# Patient Record
Sex: Male | Born: 1990 | Race: White | Hispanic: No | Marital: Single | Smoking: Current every day smoker
Health system: Southern US, Community
[De-identification: ages and names within clinical notes are randomized; demographics above are authoritative.]

---

## 2008-02-12 ENCOUNTER — Emergency Department (HOSPITAL_COMMUNITY): Admission: EM | Admit: 2008-02-12 | Discharge: 2008-02-12 | Payer: Self-pay | Admitting: Emergency Medicine

## 2008-11-23 ENCOUNTER — Emergency Department (HOSPITAL_COMMUNITY): Admission: EM | Admit: 2008-11-23 | Discharge: 2008-11-23 | Payer: Self-pay | Admitting: Emergency Medicine

## 2009-07-10 ENCOUNTER — Emergency Department (HOSPITAL_COMMUNITY): Admission: EM | Admit: 2009-07-10 | Discharge: 2009-07-10 | Payer: Self-pay | Admitting: Emergency Medicine

## 2010-06-19 ENCOUNTER — Emergency Department (HOSPITAL_COMMUNITY)
Admission: EM | Admit: 2010-06-19 | Discharge: 2010-06-19 | Payer: Self-pay | Source: Home / Self Care | Admitting: Emergency Medicine

## 2013-10-26 ENCOUNTER — Encounter (HOSPITAL_COMMUNITY): Payer: Self-pay | Admitting: Emergency Medicine

## 2013-10-26 ENCOUNTER — Inpatient Hospital Stay (HOSPITAL_COMMUNITY)
Admission: EM | Admit: 2013-10-26 | Discharge: 2013-10-28 | DRG: 684 | Disposition: A | Discharge status: Home / Self Care | Payer: Self-pay | Attending: Internal Medicine | Admitting: Internal Medicine

## 2013-10-26 ENCOUNTER — Emergency Department (HOSPITAL_COMMUNITY): Payer: Self-pay

## 2013-10-26 DIAGNOSIS — Z8261 Family history of arthritis: Secondary | ICD-10-CM

## 2013-10-26 DIAGNOSIS — T675XXA Heat exhaustion, unspecified, initial encounter: Secondary | ICD-10-CM | POA: Diagnosis present

## 2013-10-26 DIAGNOSIS — X30XXXA Exposure to excessive natural heat, initial encounter: Secondary | ICD-10-CM | POA: Diagnosis present

## 2013-10-26 DIAGNOSIS — N179 Acute kidney failure, unspecified: Principal | ICD-10-CM | POA: Diagnosis present

## 2013-10-26 DIAGNOSIS — F17200 Nicotine dependence, unspecified, uncomplicated: Secondary | ICD-10-CM | POA: Diagnosis present

## 2013-10-26 DIAGNOSIS — E876 Hypokalemia: Secondary | ICD-10-CM | POA: Diagnosis present

## 2013-10-26 DIAGNOSIS — R55 Syncope and collapse: Secondary | ICD-10-CM | POA: Diagnosis present

## 2013-10-26 DIAGNOSIS — Y93H2 Activity, gardening and landscaping: Secondary | ICD-10-CM

## 2013-10-26 DIAGNOSIS — E860 Dehydration: Secondary | ICD-10-CM | POA: Diagnosis present

## 2013-10-26 DIAGNOSIS — R112 Nausea with vomiting, unspecified: Secondary | ICD-10-CM | POA: Diagnosis present

## 2013-10-26 DIAGNOSIS — Z825 Family history of asthma and other chronic lower respiratory diseases: Secondary | ICD-10-CM

## 2013-10-26 LAB — URINALYSIS, ROUTINE W REFLEX MICROSCOPIC
BILIRUBIN URINE: NEGATIVE
Glucose, UA: NEGATIVE mg/dL
Hgb urine dipstick: NEGATIVE
KETONES UR: NEGATIVE mg/dL
Leukocytes, UA: NEGATIVE
Nitrite: NEGATIVE
PH: 6 (ref 5.0–8.0)
Protein, ur: NEGATIVE mg/dL
Specific Gravity, Urine: 1.006 (ref 1.005–1.030)
Urobilinogen, UA: 0.2 mg/dL (ref 0.0–1.0)

## 2013-10-26 LAB — COMPREHENSIVE METABOLIC PANEL
ALBUMIN: 5.8 g/dL — AB (ref 3.5–5.2)
ALK PHOS: 108 U/L (ref 39–117)
ALT: 34 U/L (ref 0–53)
AST: 34 U/L (ref 0–37)
BUN: 33 mg/dL — ABNORMAL HIGH (ref 6–23)
CALCIUM: 11.2 mg/dL — AB (ref 8.4–10.5)
CO2: 19 meq/L (ref 19–32)
CREATININE: 3.84 mg/dL — AB (ref 0.50–1.35)
Chloride: 90 mEq/L — ABNORMAL LOW (ref 96–112)
GFR calc non Af Amer: 20 mL/min — ABNORMAL LOW (ref >90)
GFR, EST AFRICAN AMERICAN: 24 mL/min — AB (ref >90)
Glucose, Bld: 173 mg/dL — ABNORMAL HIGH (ref 70–99)
Potassium: 4.1 mEq/L (ref 3.7–5.3)
SODIUM: 136 meq/L — AB (ref 137–147)
TOTAL PROTEIN: 10.1 g/dL — AB (ref 6.0–8.3)
Total Bilirubin: 1.9 mg/dL — ABNORMAL HIGH (ref 0.3–1.2)

## 2013-10-26 LAB — CBC WITH DIFFERENTIAL/PLATELET
BASOS PCT: 0 % (ref 0–1)
Basophils Absolute: 0.1 10*3/uL (ref 0.0–0.1)
EOS ABS: 0 10*3/uL (ref 0.0–0.7)
EOS PCT: 0 % (ref 0–5)
HCT: 56.6 % — ABNORMAL HIGH (ref 39.0–52.0)
Hemoglobin: 20.6 g/dL — ABNORMAL HIGH (ref 13.0–17.0)
Lymphocytes Relative: 9 % — ABNORMAL LOW (ref 12–46)
Lymphs Abs: 1.7 10*3/uL (ref 0.7–4.0)
MCH: 31.7 pg (ref 26.0–34.0)
MCHC: 36.4 g/dL — ABNORMAL HIGH (ref 30.0–36.0)
MCV: 87.1 fL (ref 78.0–100.0)
MONO ABS: 1 10*3/uL (ref 0.1–1.0)
MONOS PCT: 6 % (ref 3–12)
NEUTROS ABS: 15.8 10*3/uL — AB (ref 1.7–7.7)
NEUTROS PCT: 85 % — AB (ref 43–77)
Platelets: 296 10*3/uL (ref 150–400)
RBC: 6.5 MIL/uL — AB (ref 4.22–5.81)
RDW: 12.3 % (ref 11.5–15.5)
WBC: 18.6 10*3/uL — AB (ref 4.0–10.5)

## 2013-10-26 LAB — LIPASE, BLOOD: Lipase: 22 U/L (ref 11–59)

## 2013-10-26 LAB — I-STAT TROPONIN, ED: Troponin i, poc: 0.03 ng/mL (ref 0.00–0.08)

## 2013-10-26 MED ORDER — SODIUM CHLORIDE 0.9 % IV SOLN
INTRAVENOUS | Status: DC
  Administered 2013-10-26 – 2013-10-27 (×3): via INTRAVENOUS

## 2013-10-26 MED ORDER — SODIUM CHLORIDE 0.9 % IV BOLUS (SEPSIS)
1000.0000 mL | Freq: Once | INTRAVENOUS | Status: AC
  Administered 2013-10-26: 1000 mL via INTRAVENOUS

## 2013-10-26 MED ORDER — ONDANSETRON HCL 4 MG/2ML IJ SOLN: 4.0000 mg | Freq: Four times a day (QID) | INTRAMUSCULAR | Status: DC | PRN

## 2013-10-26 MED ORDER — ENOXAPARIN SODIUM 40 MG/0.4ML ~~LOC~~ SOLN
40.0000 mg | SUBCUTANEOUS | Status: DC
  Administered 2013-10-27: 40 mg via SUBCUTANEOUS
  Filled 2013-10-26 (×2): qty 0.4

## 2013-10-26 MED ORDER — SODIUM CHLORIDE 0.9 % IV BOLUS (SEPSIS)
1000.0000 mL | Freq: Once | INTRAVENOUS | Status: AC
Start: 2013-10-26 — End: 2013-10-26
  Administered 2013-10-26: 1000 mL via INTRAVENOUS

## 2013-10-26 MED ORDER — ONDANSETRON HCL 4 MG PO TABS: 4.0000 mg | ORAL_TABLET | Freq: Four times a day (QID) | ORAL | Status: DC | PRN

## 2013-10-26 MED ORDER — OXYCODONE HCL 5 MG PO TABS: 5.0000 mg | ORAL_TABLET | ORAL | Status: DC | PRN

## 2013-10-26 MED ORDER — HYDROMORPHONE HCL PF 1 MG/ML IJ SOLN: 0.5000 mg | INTRAMUSCULAR | Status: DC | PRN

## 2013-10-26 MED ORDER — ONDANSETRON HCL 4 MG/2ML IJ SOLN
4.0000 mg | Freq: Once | INTRAMUSCULAR | Status: AC
  Administered 2013-10-26: 4 mg via INTRAVENOUS
  Filled 2013-10-26: qty 2

## 2013-10-26 MED ORDER — ACETAMINOPHEN 325 MG PO TABS: 650.0000 mg | ORAL_TABLET | Freq: Four times a day (QID) | ORAL | Status: DC | PRN

## 2013-10-26 MED ORDER — ACETAMINOPHEN 650 MG RE SUPP
650.0000 mg | Freq: Four times a day (QID) | RECTAL | Status: DC | PRN
Start: 2013-10-26 — End: 2013-10-28

## 2013-10-26 NOTE — ED Provider Notes (Signed)
CSN: [id number]     Arrival date & time 10/26/13  1804 History   First MD Initiated Contact with Patient 10/26/13 1939     Chief Complaint  Patient presents with  . Loss of Consciousness  . Emesis     (Consider location/radiation/quality/duration/timing/severity/associated sxs/prior Treatment) HPI 23 year old male presents with multiple episodes of vomiting, nausea and abdominal pain. He states he passed out twice today. He's been feeling lightheaded almost all day. 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 Today since about 9:30 he been having severe symptoms. He is having uncontrolled nausea and vomiting. He mostly has right upper quadrant abdominal pain when he is vomiting at this time and has not had any pain. He's never had any prior medical problems including no kidney disease, heart conditions, or other symptoms. The first time he passed out he was apparently mowing the lawn felt lightheaded and fell to the grass. He apparently woke up immediately after. The second time he was feeling lightheaded tried to sit down but currently fell face first on the truck. He currently feels lightheaded but denies any chest pain or headaches. He's been having decreased urine output as well.  History reviewed. No pertinent past medical history. History reviewed. No pertinent past surgical history. History reviewed. No pertinent family history. History  Substance Use Topics  . Smoking status: Current Every Day Smoker -- 0.50 packs/day    Types: Cigarettes  . Smokeless tobacco: Not on file  . Alcohol Use: Yes    Review of Systems  Respiratory: Positive for cough. Negative for shortness of breath.   Cardiovascular: Negative for chest pain.  Gastrointestinal: Positive for nausea, vomiting and abdominal pain. Negative for diarrhea and constipation.  Genitourinary: Positive for decreased urine volume. Negative for dysuria.   Musculoskeletal: Negative for back pain.  Neurological: Positive for dizziness and light-headedness.  All other systems reviewed and are negative.     Allergies  Review of patient's allergies indicates no known allergies.  Home Medications   Prior to Admission medications   Not on File   BP 130/90  Pulse 117  Temp(Src) 97.3 F (36.3 C) (Oral)  Resp 18  SpO2 97% Physical Exam  Nursing note and vitals reviewed. Constitutional: He is oriented to person, place, and time. He appears well-developed and well-nourished.  HENT:  Head: Normocephalic and atraumatic.  Right Ear: External ear normal.  Left Ear: External ear normal.  Nose: Nose normal.  Mouth/Throat: Mucous membranes are normal.  Eyes: Right eye exhibits no discharge. Left eye exhibits no discharge.  Neck: Neck supple.  Cardiovascular: Normal rate, regular rhythm, normal heart sounds and intact distal pulses.   Pulmonary/Chest: Effort normal.  Abdominal: Soft. There is no tenderness.  Musculoskeletal: He exhibits no edema.  Neurological: He is alert and oriented to person, place, and time.  Skin: Skin is warm and dry.    ED Course  Procedures (including critical care time) Labs Review Labs Reviewed  CBC WITH DIFFERENTIAL - Abnormal; Notable for the following:    WBC 18.6 (*)    RBC 6.50 (*)    Hemoglobin 20.6 (*)    HCT 56.6 (*)    MCHC 36.4 (*)    Neutrophils Relative % 85 (*)    Neutro Abs 15.8 (*)    Lymphocytes Relative 9 (*)    All other components within normal limits  COMPREHENSIVE METABOLIC PANEL - Abnormal; Notable for the following:    Sodium  136 (*)    Chloride 90 (*)    Glucose, Bld 173 (*)    BUN 33 (*)    Creatinine, Ser 3.84 (*)    Calcium 11.2 (*)    Total Protein 10.1 (*)    Albumin 5.8 (*)    Total Bilirubin 1.9 (*)    GFR calc non Af Amer 20 (*)    GFR calc Af Amer 24 (*)    All other components within normal limits  LIPASE, BLOOD  URINALYSIS, ROUTINE W REFLEX MICROSCOPIC   I-STAT TROPOININ, ED    Imaging Review Dg Abd Acute W/chest  10/26/2013   CLINICAL DATA:  Syncope.  Abdominal pain.  EXAM: ACUTE ABDOMEN SERIES (ABDOMEN 2 VIEW & CHEST 1 VIEW)  COMPARISON:  11/23/2008  FINDINGS: There is no evidence of dilated bowel loops or free intraperitoneal air. No radiopaque calculi or other significant radiographic abnormality is seen. Heart size and mediastinal contours are within normal limits. Both lungs are clear. Interval, healed mid left clavicle fracture.  IMPRESSION: Negative abdominal radiographs.  No acute cardiopulmonary disease.   Electronically Signed   By: Tiburcio Pea M.D.   On: 10/26/2013 20:51     EKG Interpretation   Date/Time:  Friday Oct 26 2013 18:18:09 EDT Ventricular Rate:  126 PR Interval:  124 QRS Duration: 84 QT Interval:  306 QTC Calculation: 443 R Axis:   83 Text Interpretation:  Sinus tachycardia Right atrial enlargement  Nonspecific ST abnormality Abnormal ECG No old tracing to compare  Confirmed by Long Brimage  MD, Brennan Karam ([id number]) on 10/26/2013 7:37:09 PM      MDM   Final diagnoses:  Dehydration  ARF (acute renal failure)  Heat exhaustion  Hypercalcemia  Nausea and vomiting  Syncope    Patient with what appears to be acute dehydration causing multiple episodes of syncope. His BUN is a little lower than expected given dehydration, but his Cr of 3.8 is concerning in otherwise healthy young male. Benign abdominal exam. Will hydrate and admit to medicine.    Audree Camel, MD 10/27/13 [phone]

## 2013-10-26 NOTE — Progress Notes (Signed)
Pt arrived on unit, alert and oriented. Able to make needs known. Appears to be in no distress. No SOB noted. IV on L hand clean, dry and intact, infusing well. Tattoos noted on BUE. Skin dry and intact. No complaints made at this time. Vital signs taken and stable. Pt care guide provided. We will continue to monitor.

## 2013-10-26 NOTE — ED Notes (Signed)
Pt has been working in the hot sun all day started getting ill at 1300 today.  Nausea with vomiting.  He is drinking sprite told to discontinue at present.  Iv placed nausea med given.  Alert oriented skin warm and dry nodistress

## 2013-10-26 NOTE — Progress Notes (Signed)
Received pt report from Emily,RN-ED. 

## 2013-10-26 NOTE — ED Notes (Signed)
The pt just returned from xray 

## 2013-10-26 NOTE — ED Notes (Addendum)
Pt reports lightheadedness and N/V starting yesterday. States "I have been throwing up so much. And this afternoon, my legs just gave out on me." Unsure of LOC, but states episode was witnessed and he stood up right after. Pt also reports 8/10 RLQ/RUQ starting this afternoon. Pt is AO x4, ambulatory to triage. Neuro intact. PERRLA, 55mm.

## 2013-10-26 NOTE — H&P (Signed)
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  He denies hematemesis,  He report also passing out twice.   He denies having any fevers or chills or diarrhea or chest pain or SOB.   He was brought to the ED, and he was evaluated and found to have abnormal lab studies of increased BUN/Cr as well as hemoconcentration.  He was administered IVFs and referred for medical admission.     Review of Systems:  Constitutional: No Weight Loss, No Weight Gain, Night Sweats, Fevers, Chills, +Fatigue, +Generalized Weakness HEENT: No Headaches, Difficulty Swallowing,Tooth/Dental Problems,Sore Throat,  No Sneezing, Rhinitis, Ear Ache, Nasal Congestion, or Post Nasal Drip,  Cardio-vascular:  No Chest pain, Orthopnea, PND, Edema in lower extremities, Anasarca, Dizziness, Palpitations  Resp: No Dyspnea, No DOE, No Cough, No Hemoptysis, No Wheezing.    GI: No Heartburn, Indigestion, Abdominal Pain, +Nausea, +Vomiting, Diarrhea, Change in Bowel Habits,  Loss of Appetite  GU: No Dysuria, Change in Color of Urine, No Urgency or Frequency.  No Flank pain.  Musculoskeletal: No Joint Pain or Swelling.  No Decreased Range of Motion. No Back Pain.  Neurologic: +Syncope, No Seizures, Muscle Weakness, Paresthesia, Vision Disturbance or Loss, No Diplopia, No Vertigo, No Difficulty Walking,  Skin: No Rash or Lesions. Psych: No Change in Mood or Affect. No Depression or Anxiety. No Memory loss. No Confusion or Hallucinations   History reviewed. No pertinent past medical history.   NONE   History reviewed.  No pertinent past surgical history.    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  He has been smoking about 0.50 packs per day. He does not have any smokeless tobacco history on file. He reports that he drinks alcohol. He reports that he does not use illicit drugs.     Family History  Problem Relation Age of Onset  . Asthma Mother   . Arthritis Mother        Physical Exam:  GEN:  Pleasant Sun Burned, Well Nourished and Well Developed 23 y.o. Caucasian male  examined  and in no acute distress; cooperative with exam Filed Vitals:   10/26/13 1816 10/26/13 1937 10/26/13 2015 10/26/13 2058  BP: 125/85 130/90 125/95 122/105  Pulse: 126 117 106 95  Temp: 97.3 F (36.3 C)  98.1 F (36.7 C)   TempSrc: Oral     Resp: 18 18 16 18   SpO2: 95% 97% 97% 96%   Blood pressure 122/105, pulse 95, temperature 98.1 F (36.7 C), temperature source Oral, resp. rate 18, SpO2 96.00%. PSYCH: He is alert and oriented x4; does not appear anxious does not appear depressed; affect is normal HEENT: Normocephalic and Atraumatic, Mucous membranes pink; PERRLA; EOM intact; Fundi:  Benign;  No scleral icterus, Nares: Patent, Oropharynx: Clear, Fair Dentition, Neck:  FROM, no cervical lymphadenopathy nor thyromegaly or carotid bruit; no JVD; Breasts:: Not examined CHEST WALL: No tenderness  CHEST: Normal respiration, clear to auscultation bilaterally HEART: Regular rate and rhythm; no murmurs rubs or gallops BACK: No kyphosis or scoliosis; no CVA tenderness ABDOMEN: Positive Bowel Sounds, Scaphoid, soft non-tender; no masses, no organomegaly. Rectal Exam: Not done EXTREMITIES: No cyanosis, clubbing or edema; no ulcerations. Genitalia: not examined PULSES: 2+ and symmetric SKIN: Decreased hydration,  no rashes or ulcerations CNS: Alert and Oriented x 4, No Focal Deficits.    Vascular: pulses palpable throughout    Labs on Admission:  Basic Metabolic Panel:  Recent Labs Lab 10/26/13 1819  NA 136*  K 4.1  CL 90*  CO2 19  GLUCOSE 173*  BUN 33*  CREATININE 3.84*  CALCIUM 11.2*   Liver Function Tests:  Recent Labs Lab 10/26/13 1819  AST 34  ALT 34  ALKPHOS 108  BILITOT 1.9*  PROT 10.1*  ALBUMIN 5.8*    Recent Labs Lab 10/26/13 1819  LIPASE 22   No results found for this basename: AMMONIA,  in the last 168 hours CBC:  Recent Labs Lab 10/26/13 1819  WBC 18.6*  NEUTROABS 15.8*  HGB 20.6*  HCT 56.6*  MCV 87.1  PLT 296   Cardiac Enzymes: No results found for this basename: CKTOTAL, CKMB, CKMBINDEX, TROPONINI,  in the last 168 hours  BNP (last 3 results) No results found for this basename: PROBNP,  in the last 8760 hours CBG: No results found for this basename: GLUCAP,  in the last 168 hours  Radiological Exams on Admission: Dg Abd Acute W/chest  10/26/2013   CLINICAL DATA:  Syncope.  Abdominal pain.  EXAM: ACUTE ABDOMEN SERIES (ABDOMEN 2 VIEW & CHEST 1 VIEW)  COMPARISON:  11/23/2008  FINDINGS: There is no evidence of dilated bowel loops or free intraperitoneal air. No radiopaque calculi or other significant radiographic abnormality is seen. Heart size and mediastinal contours are within normal limits. Both lungs are clear. Interval, healed mid left clavicle fracture.  IMPRESSION: Negative abdominal radiographs.  No acute cardiopulmonary disease.   Electronically Signed   By: Tiburcio PeaJonathan  Watts M.D.   On: 10/26/2013 20:51       Assessment/Plan:   23 y.o. male with  Principal Problem:   Dehydration Active Problems:   Nausea and vomiting   Heat exhaustion   Syncope   Hypercalcemia   ARF (acute renal failure)     1.  Dehydration/ ARF-  IVFs  For rehydration, and monitor BUN/Cr and electrolytes.     2.   Heat Exhaustion-  IVFs and supportive CAre, Monitor CPK levels.    3.   Syncope due to #1.    4.   Nausea and  Vomiting- due to #2,  Anti -Emetics PRN.    5.   Hypercalcemia- due to #1,  Monitor Calcium levels after hydration.    6.   DVT prophylaxis with Lovenox.        Code Status:    FULL CODE Family Communication:    No Family Present Disposition Plan:    Inpatient   Time spent:  5560 Minutes  Koran Seabrook Velora HecklerC Bryleigh Ottaway [facility] Pager [phone]  If 7PM-7AM, please contact night-coverage [email or web address] Password [email or web address] 10/26/2013, 10:27 PM

## 2013-10-27 LAB — BASIC METABOLIC PANEL
BUN: 38 mg/dL — ABNORMAL HIGH (ref 6–23)
CHLORIDE: 97 meq/L (ref 96–112)
CO2: 26 mEq/L (ref 19–32)
Calcium: 9.5 mg/dL (ref 8.4–10.5)
Creatinine, Ser: 2.07 mg/dL — ABNORMAL HIGH (ref 0.50–1.35)
GFR calc Af Amer: 50 mL/min — ABNORMAL LOW (ref >90)
GFR calc non Af Amer: 43 mL/min — ABNORMAL LOW (ref >90)
Glucose, Bld: 111 mg/dL — ABNORMAL HIGH (ref 70–99)
POTASSIUM: 3.6 meq/L — AB (ref 3.7–5.3)
Sodium: 138 mEq/L (ref 137–147)

## 2013-10-27 LAB — CBC
HEMATOCRIT: 47.7 % (ref 39.0–52.0)
HEMOGLOBIN: 17.6 g/dL — AB (ref 13.0–17.0)
MCH: 32.2 pg (ref 26.0–34.0)
MCHC: 36.9 g/dL — ABNORMAL HIGH (ref 30.0–36.0)
MCV: 87.2 fL (ref 78.0–100.0)
Platelets: 239 10*3/uL (ref 150–400)
RBC: 5.47 MIL/uL (ref 4.22–5.81)
RDW: 12.5 % (ref 11.5–15.5)
WBC: 14.6 10*3/uL — AB (ref 4.0–10.5)

## 2013-10-27 LAB — CK: CK TOTAL: 450 U/L — AB (ref 7–232)

## 2013-10-27 MED ORDER — POTASSIUM CHLORIDE CRYS ER 20 MEQ PO TBCR
40.0000 meq | EXTENDED_RELEASE_TABLET | Freq: Once | ORAL | Status: AC
  Administered 2013-10-27: 40 meq via ORAL
  Filled 2013-10-27: qty 2

## 2013-10-27 NOTE — Progress Notes (Signed)
  PROGRESS NOTE  Moiz Birman NLZ:[id number] DOB: Sep 28, 1990 DOA: 10/26/2013 PCP: No PCP Per Patient  Assessment/Plan: Dehydration/ ARF- IVFs, monitor BUN/Cr and electrolytes.    Heat Exhaustion- IVFs and supportive CAre, Monitor CPK levels.   Syncope due to dehydration  Nausea and Vomiting- due to heat exhaustion, Anti -Emetics PRN.   Hypercalcemia- due to #1, Monitor Calcium levels after hydration.   Hypokalemia -replete   DVT prophylaxis with Lovenox.    Code Status: full Family Communication: patient and family at bedside Disposition Plan: admit   Consultants:    Procedures:      HPI/Subjective: Felling better No muscle cramping  Objective: Filed Vitals:   10/27/13 0358  BP: 130/78  Pulse: 82  Temp: 98.1 F (36.7 C)  Resp: 18   No intake or output data in the 24 hours ending 10/27/13 1000 Filed Weights   10/26/13 2336  Weight: 102.5 kg (225 lb 15.5 oz)    Exam:   General:  A+Ox3, NAD  Cardiovascular: rrr  Respiratory: clear anterior  Abdomen: +BS, soft  Musculoskeletal: moves all 4 ext   Data Reviewed: Basic Metabolic Panel:  Recent Labs Lab 10/26/13 1819 10/27/13 0540  NA 136* 138  K 4.1 3.6*  CL 90* 97  CO2 19 26  GLUCOSE 173* 111*  BUN 33* 38*  CREATININE 3.84* 2.07*  CALCIUM 11.2* 9.5   Liver Function Tests:  Recent Labs Lab 10/26/13 1819  AST 34  ALT 34  ALKPHOS 108  BILITOT 1.9*  PROT 10.1*  ALBUMIN 5.8*    Recent Labs Lab 10/26/13 1819  LIPASE 22   No results found for this basename: AMMONIA,  in the last 168 hours CBC:  Recent Labs Lab 10/26/13 1819 10/27/13 0540  WBC 18.6* 14.6*  NEUTROABS 15.8*  --   HGB 20.6* 17.6*  HCT 56.6* 47.7  MCV 87.1 87.2  PLT 296 239   Cardiac Enzymes:  Recent Labs Lab 10/27/13 0540  CKTOTAL 450*   BNP (last 3 results) No results found for this basename: PROBNP,  in the last 8760 hours CBG: No results found for this basename: GLUCAP,  in the last 168  hours  No results found for this or any previous visit (from the past 240 hour(s)).   Studies: Dg Abd Acute W/chest  10/26/2013   CLINICAL DATA:  Syncope.  Abdominal pain.  EXAM: ACUTE ABDOMEN SERIES (ABDOMEN 2 VIEW & CHEST 1 VIEW)  COMPARISON:  11/23/2008  FINDINGS: There is no evidence of dilated bowel loops or free intraperitoneal air. No radiopaque calculi or other significant radiographic abnormality is seen. Heart size and mediastinal contours are within normal limits. Both lungs are clear. Interval, healed mid left clavicle fracture.  IMPRESSION: Negative abdominal radiographs.  No acute cardiopulmonary disease.   Electronically Signed   By: Tiburcio Pea M.D.   On: 10/26/2013 20:51    Scheduled Meds: . enoxaparin (LOVENOX) injection  40 mg Subcutaneous Q24H   Continuous Infusions: . sodium chloride 100 mL/hr at 10/27/13 0825   Antibiotics Given (last 72 hours)   None      Principal Problem:   Dehydration Active Problems:   Nausea and vomiting   Heat exhaustion   Syncope   Hypercalcemia   ARF (acute renal failure)    Time spent: 25 min    Joseph Art  [facility] Pager [phone] If 7PM-7AM, please contact night-coverage at [email or web address], password [email or web address] 10/27/2013, 10:00 AM  LOS: 1 day

## 2013-10-28 LAB — CBC
HEMATOCRIT: 42.6 % (ref 39.0–52.0)
Hemoglobin: 14.8 g/dL (ref 13.0–17.0)
MCH: 30.9 pg (ref 26.0–34.0)
MCHC: 34.7 g/dL (ref 30.0–36.0)
MCV: 88.9 fL (ref 78.0–100.0)
Platelets: 188 10*3/uL (ref 150–400)
RBC: 4.79 MIL/uL (ref 4.22–5.81)
RDW: 12.4 % (ref 11.5–15.5)
WBC: 5.5 10*3/uL (ref 4.0–10.5)

## 2013-10-28 LAB — BASIC METABOLIC PANEL
BUN: 28 mg/dL — AB (ref 6–23)
CALCIUM: 9 mg/dL (ref 8.4–10.5)
CHLORIDE: 101 meq/L (ref 96–112)
CO2: 27 meq/L (ref 19–32)
CREATININE: 0.96 mg/dL (ref 0.50–1.35)
GFR calc Af Amer: >90 mL/min (ref >90)
GFR calc non Af Amer: >90 mL/min (ref >90)
GLUCOSE: 92 mg/dL (ref 70–99)
Potassium: 4 mEq/L (ref 3.7–5.3)
Sodium: 137 mEq/L (ref 137–147)

## 2013-10-28 LAB — CK: Total CK: 271 U/L — ABNORMAL HIGH (ref 7–232)

## 2013-10-28 NOTE — Discharge Instructions (Addendum)
You were cared for by a hospitalist during your hospital stay. If you have any questions about your discharge medications or the care you received while you were in the hospital after you are discharged, you can call the unit and asked to speak with the hospitalist on call if the hospitalist that took care of you is not available. Once you are discharged, your primary care physician will handle any further medical issues. Please note that NO REFILLS for any discharge medications will be authorized once you are discharged, as it is imperative that you return to your primary care physician (or establish a relationship with a primary care physician if you do not have one) for your aftercare needs so that they can reassess your need for medications and monitor your lab values.     If you do not have a primary care physician, you can call [phone] for a physician referral.  Follow with Primary MD in 1-2 weeks. Please establish with a primary provider to have your blood work repeated in ~2 weeks.   Get CBC, CMP checked by your doctor and again as further instructed.  Get a 2 view Chest X ray done next visit if you had Pneumonia of Lung problems at the Hospital.  Get Medicines reviewed and adjusted.  Please request your Prim.MD to go over all Hospital Tests and Procedure/Radiological results at the follow up, please get all Hospital records sent to your Prim MD by signing hospital release before you go home.  Activity: As tolerated with Full fall precautions use walker/cane & assistance as needed  Diet: regular  For Heart failure patients - Check your Weight same time everyday, if you gain over 2 pounds, or you develop in leg swelling, experience more shortness of breath or chest pain, call your Primary MD immediately. Follow Cardiac Low Salt Diet and 1.8 lit/day fluid restriction.  Disposition Home  If you experience worsening of your admission symptoms, develop shortness of breath, life threatening  emergency, suicidal or homicidal thoughts you must seek medical attention immediately by calling 911 or calling your MD immediately  if symptoms less severe.  You Must read complete instructions/literature along with all the possible adverse reactions/side effects for all the Medicines you take and that have been prescribed to you. Take any new Medicines after you have completely understood and accpet all the possible adverse reactions/side effects.   Do not drive and provide baby sitting services if your were admitted for syncope or siezures until you have seen by Primary MD or a Neurologist and advised to do so again.  Do not drive when taking Pain medications.   Do not take more than prescribed Pain, Sleep and Anxiety Medications  Special Instructions: If you have smoked or chewed Tobacco  in the last 2 yrs please stop smoking, stop any regular Alcohol  and or any Recreational drug use.  Wear Seat belts while driving.

## 2013-10-28 NOTE — Discharge Summary (Signed)
Physician Discharge Summary  Evan SamplesJohnny Lambert WUJ:[id number] DOB: 03-14-1991 DOA: 10/26/2013  PCP: No PCP Per Patient  Admit date: 10/26/2013 Discharge date: 10/28/2013  Time spent: 25 minutes  Recommendations for Outpatient Follow-up:  1. 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 He denies hematemesis, He report also passing out twice. He denies having any fevers or chills or diarrhea or chest pain or SOB. He was brought to the ED, and he was evaluated and found to have abnormal lab studies of increased BUN/Cr as well as hemoconcentration. He was administered IVFs and referred for medical admission.   Hospital Course:  Patient was admitted with severe dehydration and renal failure likely from working in the sun for a full day without adequate hydration. He received IV fluids and his renal function improved to normal with a Cr of 0.9 from 3.8 on admission. He was counseled for signs and symptoms of dehydration and adequate measures to take to prevent this from happening in the future, was counseled to establish with a PCP and have his blood work repeated in 1-2 weeks following discharge.   Procedures:  None    Consultations:  None   Discharge Exam: Filed Vitals:   10/26/13 2320 10/26/13 2336  10/27/13 0358 10/27/13 2024  BP: 145/85 119/78 130/78 128/74  Pulse: 95 87 82 68  Temp: 98.1 F (36.7 C) 98 F (36.7 C) 98.1 F (36.7 C) 97.6 F (36.4 C)  TempSrc: Oral Oral Oral Oral  Resp: 18 18 18 18   Height:  6\' 1"  (1.854 m)    Weight:  102.5 kg (225 lb 15.5 oz)    SpO2: 99% 98% 96% 97%   General: NAD Cardiovascular: RRR Respiratory: CTA biL  Discharge Instructions    Medication List    Notice   You have not been prescribed any medications.         Follow-up Information   Follow up with PCP . Schedule an appointment as soon as possible for a visit in 2 weeks.      The results of significant diagnostics from this hospitalization (including imaging, microbiology, ancillary and laboratory) are listed below for reference.    Significant Diagnostic Studies: Dg Abd Acute W/chest  10/26/2013   CLINICAL DATA:  Syncope.  Abdominal pain.  EXAM: ACUTE ABDOMEN SERIES (ABDOMEN 2 VIEW & CHEST 1 VIEW)  COMPARISON:  11/23/2008  FINDINGS: There is no evidence of dilated bowel loops or free intraperitoneal air. No radiopaque calculi or other significant radiographic abnormality is seen. Heart size and mediastinal contours are within normal limits. Both lungs are clear. Interval, healed mid left clavicle fracture.  IMPRESSION: Negative abdominal radiographs.  No acute cardiopulmonary disease.   Electronically Signed   By: Audry RilesJonathan  Watts M.D.  On: 10/26/2013 20:51   Labs: Basic Metabolic Panel:  Recent Labs Lab 10/26/13 1819 10/27/13 0540 10/28/13 0642  NA 136* 138 137  K 4.1 3.6* 4.0  CL 90* 97 101  CO2 19 26 27   GLUCOSE 173* 111* 92  BUN 33* 38* 28*  CREATININE 3.84* 2.07* 0.96  CALCIUM 11.2* 9.5 9.0   Liver Function Tests:  Recent Labs Lab 10/26/13 1819  AST 34  ALT 34  ALKPHOS 108  BILITOT 1.9*  PROT 10.1*  ALBUMIN 5.8*    Recent Labs Lab 10/26/13 1819  LIPASE 22   CBC:  Recent Labs Lab 10/26/13 1819 10/27/13 0540 10/28/13 0642  WBC 18.6* 14.6*  5.5  NEUTROABS 15.8*  --   --   HGB 20.6* 17.6* 14.8  HCT 56.6* 47.7 42.6  MCV 87.1 87.2 88.9  PLT 296 239 188   Cardiac Enzymes:  Recent Labs Lab 10/27/13 0540 10/28/13 0642  CKTOTAL 450* 271*   Signed:  Costin M Gherghe  [facility] 10/28/2013, 2:19 PM

## 2014-07-16 IMAGING — CR DG ABDOMEN ACUTE W/ 1V CHEST
3 series · 3 of 3 positions shown · non-contrast
Comparison: 11/23/2008

CLINICAL DATA: Syncope.  Abdominal pain.

EXAM:
ACUTE ABDOMEN SERIES (ABDOMEN 2 VIEW & CHEST 1 VIEW)

[w chest pa]
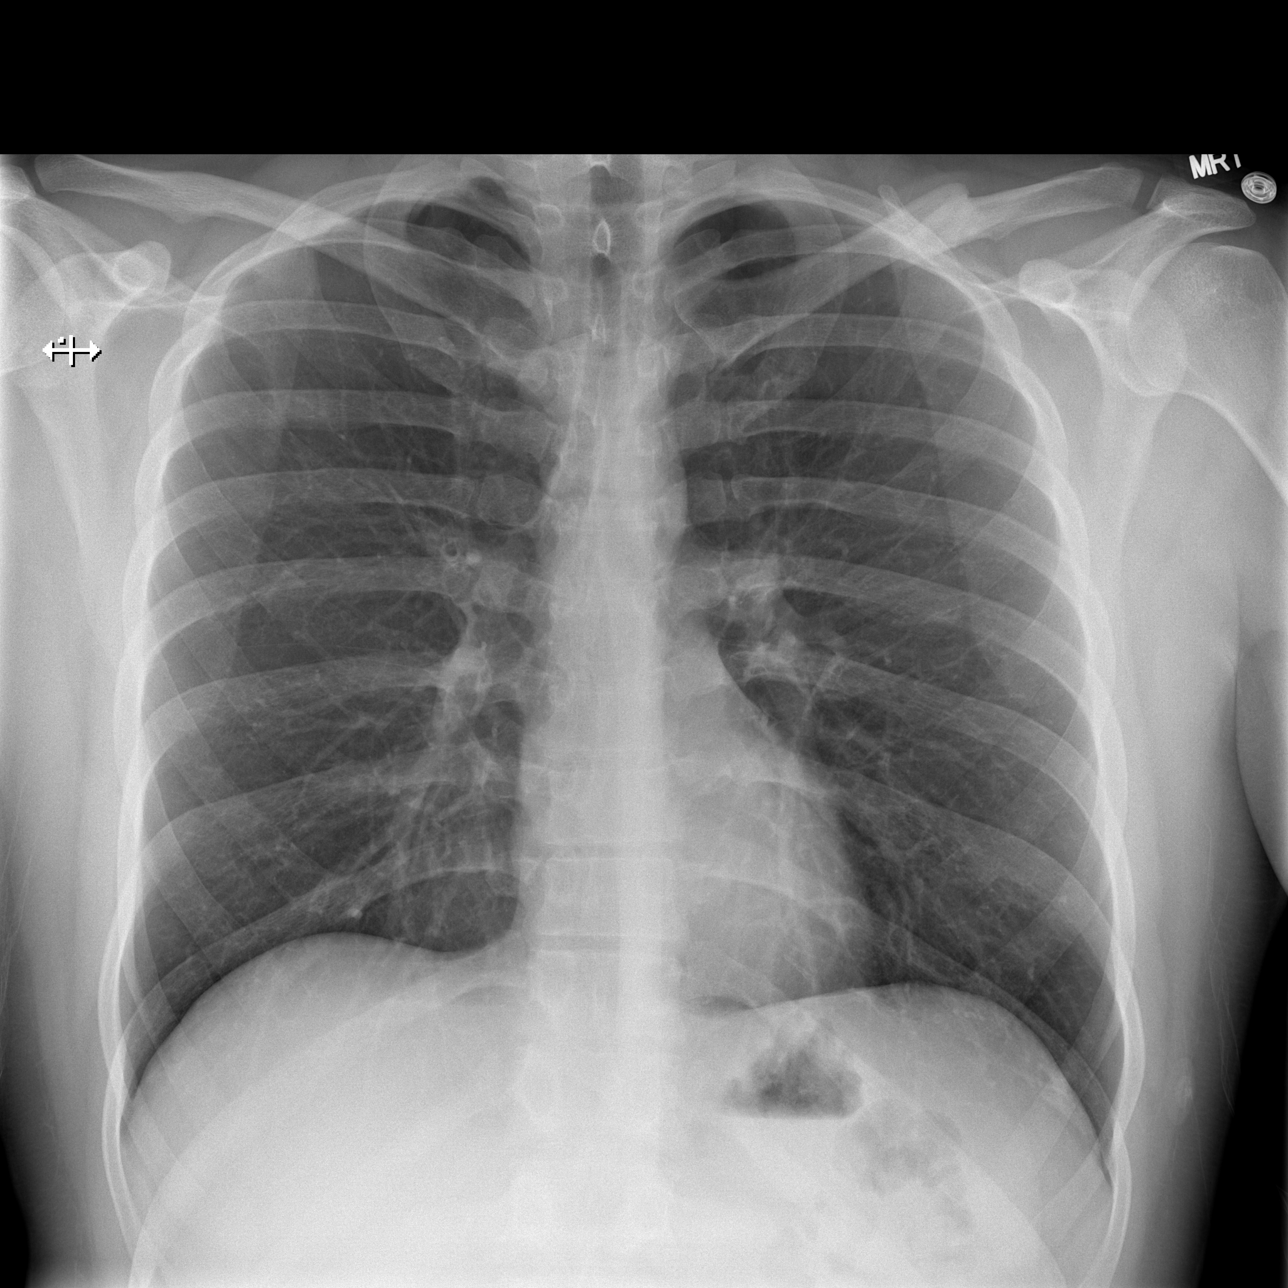

[w abdomen upright]
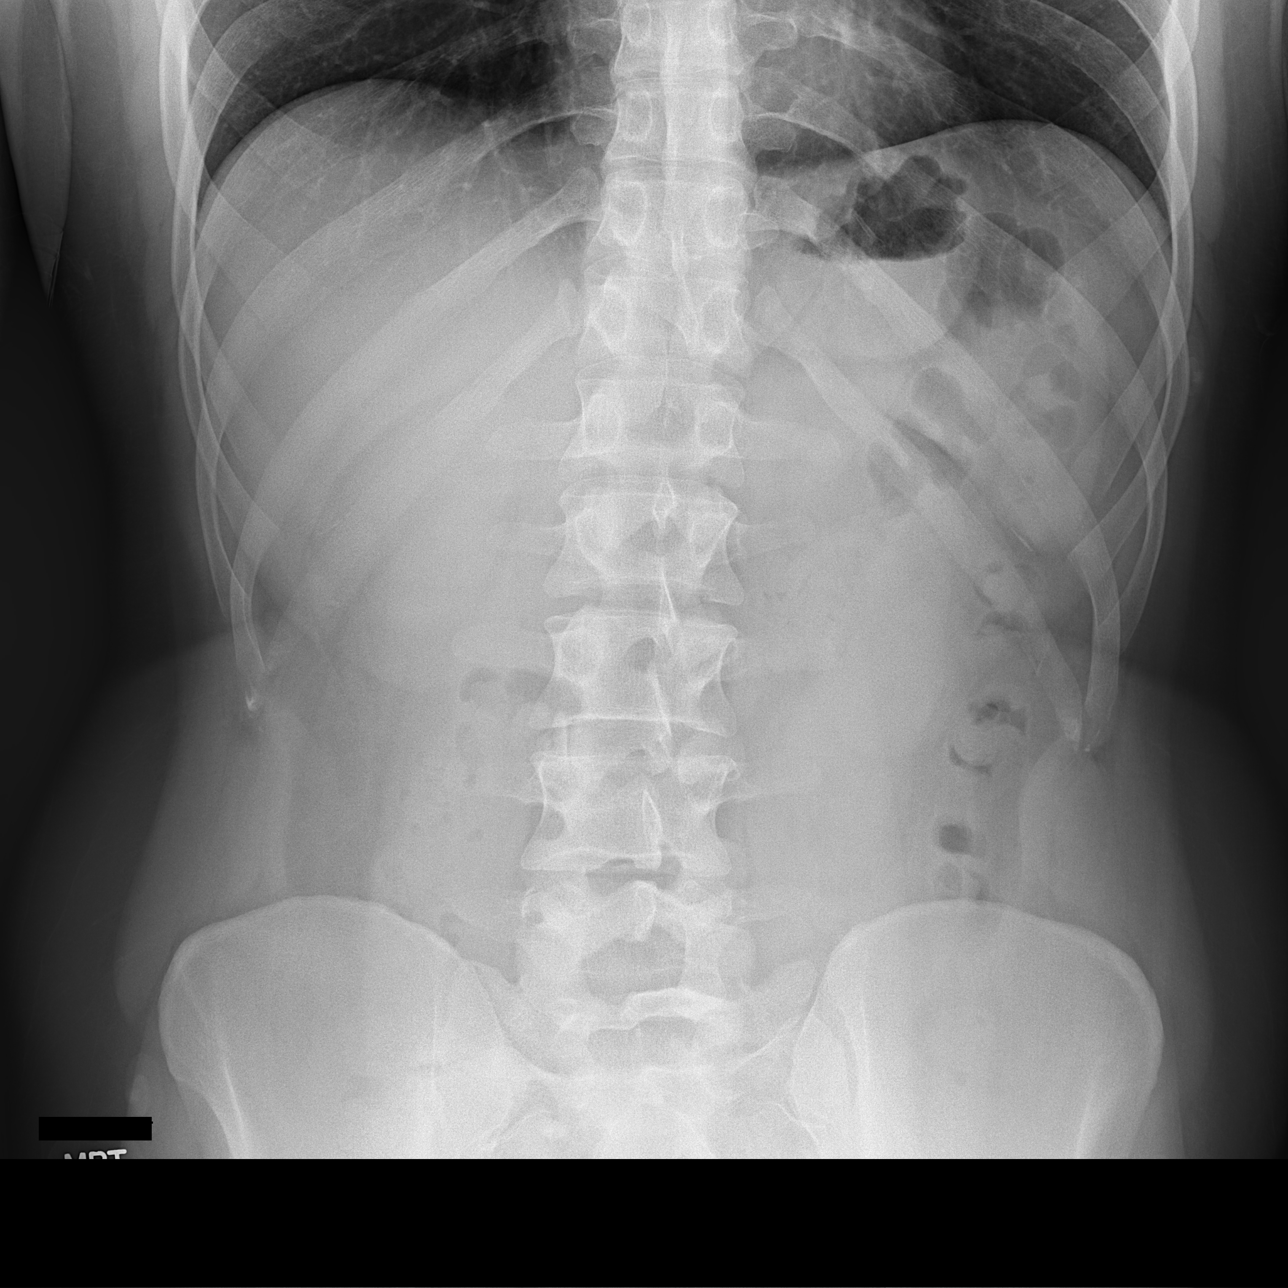

[t abdomen supine]
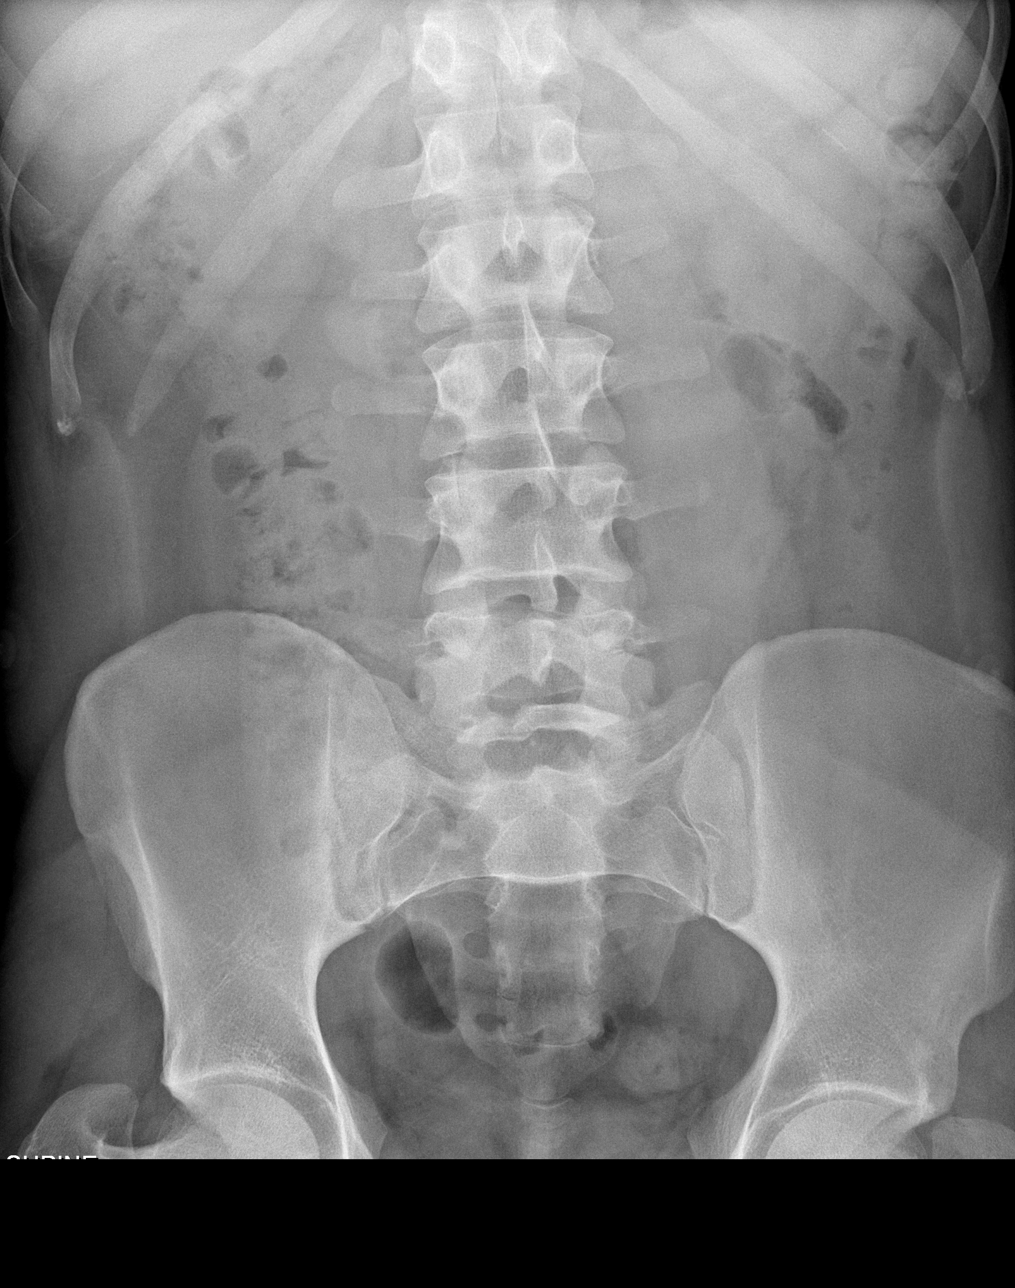

[3 of 3 positions shown; findings below may reference images not displayed]

FINDINGS: There is no evidence of dilated bowel loops or free intraperitoneal
air. No radiopaque calculi or other significant radiographic
abnormality is seen. Heart size and mediastinal contours are within
normal limits. Both lungs are clear. Interval, healed mid left
clavicle fracture.
IMPRESSION: Negative abdominal radiographs.  No acute cardiopulmonary disease.
